# Patient Record
Sex: Male | Born: 1995 | Race: Black or African American | Hispanic: No | Marital: Single
Health system: Southern US, Community
[De-identification: ages and names within clinical notes are randomized; demographics above are authoritative.]

---

## 2019-09-17 ENCOUNTER — Other Ambulatory Visit: Payer: Self-pay

## 2019-09-17 ENCOUNTER — Encounter (HOSPITAL_COMMUNITY): Payer: Self-pay

## 2019-09-17 ENCOUNTER — Emergency Department (HOSPITAL_COMMUNITY)
Admission: EM | Admit: 2019-09-17 | Discharge: 2019-09-18 | Disposition: A | Discharge status: Home / Self Care | Payer: Self-pay | Attending: Emergency Medicine | Admitting: Emergency Medicine

## 2019-09-17 DIAGNOSIS — Z20828 Contact with and (suspected) exposure to other viral communicable diseases: Secondary | ICD-10-CM | POA: Insufficient documentation

## 2019-09-17 DIAGNOSIS — J069 Acute upper respiratory infection, unspecified: Secondary | ICD-10-CM | POA: Insufficient documentation

## 2019-09-17 NOTE — ED Triage Notes (Signed)
Patient arrived stating that yesterday he began having a fever, cough, and some shortness of breath on exertion. States he does work but unsure of possible exposure to Covid-19. Patient taking Robitussin, Musinex, and Tylenol at home.

## 2019-09-18 ENCOUNTER — Telehealth: Payer: Self-pay

## 2019-09-18 ENCOUNTER — Emergency Department (HOSPITAL_COMMUNITY): Payer: Self-pay

## 2019-09-18 LAB — BASIC METABOLIC PANEL
Anion gap: 12 (ref 5–15)
BUN: 10 mg/dL (ref 6–20)
CO2: 22 mmol/L (ref 22–32)
Calcium: 9.3 mg/dL (ref 8.9–10.3)
Chloride: 105 mmol/L (ref 98–111)
Creatinine, Ser: 1.08 mg/dL (ref 0.61–1.24)
GFR calc Af Amer: >60 mL/min (ref >60)
GFR calc non Af Amer: >60 mL/min (ref >60)
Glucose, Bld: 98 mg/dL (ref 70–99)
Potassium: 3.6 mmol/L (ref 3.5–5.1)
Sodium: 139 mmol/L (ref 135–145)

## 2019-09-18 LAB — CBC WITH DIFFERENTIAL/PLATELET
Abs Immature Granulocytes: 0.08 10*3/uL — ABNORMAL HIGH (ref 0.00–0.07)
Basophils Absolute: 0 10*3/uL (ref 0.0–0.1)
Basophils Relative: 0 %
Eosinophils Absolute: 0.2 10*3/uL (ref 0.0–0.5)
Eosinophils Relative: 1 %
HCT: 48.2 % (ref 39.0–52.0)
Hemoglobin: 16.3 g/dL (ref 13.0–17.0)
Immature Granulocytes: 0 %
Lymphocytes Relative: 8 %
Lymphs Abs: 1.3 10*3/uL (ref 0.7–4.0)
MCH: 30.5 pg (ref 26.0–34.0)
MCHC: 33.8 g/dL (ref 30.0–36.0)
MCV: 90.1 fL (ref 80.0–100.0)
Monocytes Absolute: 1.3 10*3/uL — ABNORMAL HIGH (ref 0.1–1.0)
Monocytes Relative: 7 %
Neutro Abs: 14.8 10*3/uL — ABNORMAL HIGH (ref 1.7–7.7)
Neutrophils Relative %: 84 %
Platelets: 214 10*3/uL (ref 150–400)
RBC: 5.35 MIL/uL (ref 4.22–5.81)
RDW: 13.3 % (ref 11.5–15.5)
WBC: 17.8 10*3/uL — ABNORMAL HIGH (ref 4.0–10.5)
nRBC: 0 % (ref 0.0–0.2)

## 2019-09-18 LAB — SARS CORONAVIRUS 2 (TAT 6-24 HRS): SARS Coronavirus 2: NEGATIVE

## 2019-09-18 MED ORDER — SODIUM CHLORIDE 0.9 % IV BOLUS
1000.0000 mL | Freq: Once | INTRAVENOUS | Status: AC
  Administered 2019-09-18: 1000 mL via INTRAVENOUS

## 2019-09-18 NOTE — Discharge Instructions (Addendum)
Drink plenty of fluids and get plenty of rest.  Tylenol 1000 mg rotated with ibuprofen 600 mg every 4 hours as needed for pain or fever.  Take over-the-counter medications as needed for relief of symptoms.  Return to the emergency department if you develop severe chest pain, difficulty breathing, or other new and concerning symptoms.         Infection Prevention Recommendations for Individuals Confirmed to have, or Being Evaluated for, 2019 Novel Coronavirus (COVID-19) Infection Who Receive Care at Home  Individuals who are confirmed to have, or are being evaluated for, COVID-19 should follow the prevention steps below until a healthcare provider or local or state health department says they can return to normal activities.  Stay home except to get medical care You should restrict activities outside your home, except for getting medical care. Do not go to work, school, or public areas, and do not use public transportation or taxis.  Call ahead before visiting your doctor Before your medical appointment, call the healthcare provider and tell them that you have, or are being evaluated for, COVID-19 infection. This will help the healthcare providers office take steps to keep other people from getting infected. Ask your healthcare provider to call the local or state health department.  Monitor your symptoms Seek prompt medical attention if your illness is worsening (e.g., difficulty breathing). Before going to your medical appointment, call the healthcare provider and tell them that you have, or are being evaluated for, COVID-19 infection. Ask your healthcare provider to call the local or state health department.  Wear a facemask You should wear a facemask that covers your nose and mouth when you are in the same room with other people and when you visit a healthcare provider. People who live with or visit you should also wear a facemask while they are in the same room with  you.  Separate yourself from other people in your home As much as possible, you should stay in a different room from other people in your home. Also, you should use a separate bathroom, if available.  Avoid sharing household items You should not share dishes, drinking glasses, cups, eating utensils, towels, bedding, or other items with other people in your home. After using these items, you should wash them thoroughly with soap and water.  Cover your coughs and sneezes Cover your mouth and nose with a tissue when you cough or sneeze, or you can cough or sneeze into your sleeve. Throw used tissues in a lined trash can, and immediately wash your hands with soap and water for at least 20 seconds or use an alcohol-based hand rub.  Wash your [facility] your hands often and thoroughly with soap and water for at least 20 seconds. You can use an alcohol-based hand sanitizer if soap and water are not available and if your hands are not visibly dirty. Avoid touching your eyes, nose, and mouth with unwashed hands.   Prevention Steps for Caregivers and Household Members of Individuals Confirmed to have, or Being Evaluated for, COVID-19 Infection Being Cared for in the Home  If you live with, or provide care at home for, a person confirmed to have, or being evaluated for, COVID-19 infection please follow these guidelines to prevent infection:  Follow healthcare providers instructions Make sure that you understand and can help the patient follow any healthcare provider instructions for all care.  Provide for the patients basic needs You should help the patient with basic needs in the home and provide  support for getting groceries, prescriptions, and other personal needs.  Monitor the patients symptoms If they are getting sicker, call his or her medical provider and tell them that the patient has, or is being evaluated for, COVID-19 infection. This will help the healthcare providers office  take steps to keep other people from getting infected. Ask the healthcare provider to call the local or state health department.  Limit the number of people who have contact with the patient If possible, have only one caregiver for the patient. Other household members should stay in another home or place of residence. If this is not possible, they should stay in another room, or be separated from the patient as much as possible. Use a separate bathroom, if available. Restrict visitors who do not have an essential need to be in the home.  Keep older adults, very young children, and other sick people away from the patient Keep older adults, very young children, and those who have compromised immune systems or chronic health conditions away from the patient. This includes people with chronic heart, lung, or kidney conditions, diabetes, and cancer.  Ensure good ventilation Make sure that shared spaces in the home have good air flow, such as from an air conditioner or an opened window, weather permitting.  Wash your hands often Wash your hands often and thoroughly with soap and water for at least 20 seconds. You can use an alcohol based hand sanitizer if soap and water are not available and if your hands are not visibly dirty. Avoid touching your eyes, nose, and mouth with unwashed hands. Use disposable paper towels to dry your hands. If not available, use dedicated cloth towels and replace them when they become wet.  Wear a facemask and gloves Wear a disposable facemask at all times in the room and gloves when you touch or have contact with the patients blood, body fluids, and/or secretions or excretions, such as sweat, saliva, sputum, nasal mucus, vomit, urine, or feces.  Ensure the mask fits over your nose and mouth tightly, and do not touch it during use. Throw out disposable facemasks and gloves after using them. Do not reuse. Wash your hands immediately after removing your facemask and  gloves. If your personal clothing becomes contaminated, carefully remove clothing and launder. Wash your hands after handling contaminated clothing. Place all used disposable facemasks, gloves, and other waste in a lined container before disposing them with other household waste. Remove gloves and wash your hands immediately after handling these items.  Do not share dishes, glasses, or other household items with the patient Avoid sharing household items. You should not share dishes, drinking glasses, cups, eating utensils, towels, bedding, or other items with a patient who is confirmed to have, or being evaluated for, COVID-19 infection. After the person uses these items, you should wash them thoroughly with soap and water.  Wash laundry thoroughly Immediately remove and wash clothes or bedding that have blood, body fluids, and/or secretions or excretions, such as sweat, saliva, sputum, nasal mucus, vomit, urine, or feces, on them. Wear gloves when handling laundry from the patient. Read and follow directions on labels of laundry or clothing items and detergent. In general, wash and dry with the warmest temperatures recommended on the label.  Clean all areas the individual has used often Clean all touchable surfaces, such as counters, tabletops, doorknobs, bathroom fixtures, toilets, phones, keyboards, tablets, and bedside tables, every day. Also, clean any surfaces that may have blood, body fluids, and/or secretions or excretions  on them. Wear gloves when cleaning surfaces the patient has come in contact with. Use a diluted bleach solution (e.g., dilute bleach with 1 part bleach and 10 parts water) or a household disinfectant with a label that says EPA-registered for coronaviruses. To make a bleach solution at home, add 1 tablespoon of bleach to 1 quart (4 cups) of water. For a larger supply, add  cup of bleach to 1 gallon (16 cups) of water. Read labels of cleaning products and follow  recommendations provided on product labels. Labels contain instructions for safe and effective use of the cleaning product including precautions you should take when applying the product, such as wearing gloves or eye protection and making sure you have good ventilation during use of the product. Remove gloves and wash hands immediately after cleaning.  Monitor yourself for signs and symptoms of illness Caregivers and household members are considered close contacts, should monitor their health, and will be asked to limit movement outside of the home to the extent possible. Follow the monitoring steps for close contacts listed on the symptom monitoring form.   ? If you have additional questions, contact your local health department or call the epidemiologist on call at [phone] (available 24/7). ? This guidance is subject to change. For the most up-to-date guidance from [facility], please refer to their website: [email or web address]

## 2019-09-18 NOTE — ED Provider Notes (Signed)
[facility] DEPT Provider Note   CSN: [id number] Arrival date & time: 09/17/19  2141     History   Chief Complaint Chief Complaint  Patient presents with  . Fever    HPI Derek Lucas is a 23 y.o. male.     Patient is a 23 year old male with no significant past medical history.  He presents today for evaluation of fever and cough since yesterday.  He describes chest congestion and productive cough.  He describes possible exposures to COVID-19 at work, but no family members ill.  The history is provided by the patient.  Fever Severity:  Moderate Onset quality:  Gradual Duration:  2 days Timing:  Constant Progression:  Worsening Chronicity:  New Relieved by:  Nothing Worsened by:  Nothing Ineffective treatments:  None tried Associated symptoms: congestion and cough   Associated symptoms: no chills and no confusion     History reviewed. No pertinent past medical history.  There are no active problems to display for this patient.   History reviewed. No pertinent surgical history.      Home Medications    Prior to Admission medications   Not on File    Family History No family history on file.  Social History Social History   Tobacco Use  . Smoking status: Not on file  Substance Use Topics  . Alcohol use: Not on file  . Drug use: Not on file     Allergies   Penicillins   Review of Systems Review of Systems  Constitutional: Positive for fever. Negative for chills.  HENT: Positive for congestion.   Respiratory: Positive for cough.   Psychiatric/Behavioral: Negative for confusion.  All other systems reviewed and are negative.    Physical Exam Updated Vital Signs BP 123/82 (BP Location: Left Arm)   Pulse (!) 108   Temp 99 F (37.2 C) (Oral)   Resp 15   Ht 6' (1.829 m)   Wt 117.9 kg   SpO2 92%   BMI 35.26 kg/m   Physical Exam Vitals signs and nursing note reviewed.  Constitutional:      General: He  is not in acute distress.    Appearance: He is well-developed. He is not diaphoretic.  HENT:     Head: Normocephalic and atraumatic.  Neck:     Musculoskeletal: Normal range of motion and neck supple.  Cardiovascular:     Rate and Rhythm: Regular rhythm. Tachycardia present.     Heart sounds: No murmur. No friction rub.  Pulmonary:     Effort: Pulmonary effort is normal. No respiratory distress.     Breath sounds: Normal breath sounds. No wheezing or rales.  Abdominal:     General: Bowel sounds are normal. There is no distension.     Palpations: Abdomen is soft.     Tenderness: There is no abdominal tenderness.  Musculoskeletal: Normal range of motion.        General: No swelling, tenderness, deformity or signs of injury.     Right lower leg: No edema.     Left lower leg: No edema.  Skin:    General: Skin is warm and dry.  Neurological:     Mental Status: He is alert and oriented to person, place, and time.     Coordination: Coordination normal.      ED Treatments / Results  Labs (all labs ordered are listed, but only abnormal results are displayed) Labs Reviewed  SARS CORONAVIRUS 2 (TAT 6-24 HRS)  BASIC METABOLIC  PANEL  CBC WITH DIFFERENTIAL/PLATELET    EKG None  Radiology No results found.  Procedures Procedures (including critical care time)  Medications Ordered in ED Medications  sodium chloride 0.9 % bolus 1,000 mL (has no administration in time range)     Initial Impression / Assessment and Plan / ED Course  I have reviewed the triage vital signs and the nursing notes.  Pertinent labs & imaging results that were available during my care of the patient were reviewed by me and considered in my medical decision making (see chart for details).  Patient presenting here with complaints of fever, cough, and not feeling well for the past couple of days.  Patient's chest x-ray and lung exam are clear.  Patient will be tested for COVID-19.  He is slightly  tachycardic here in the ER and was given a liter of fluid with some improvement in his heart rate.  Patient will be discharged and advised to isolate at home until the results of his Covid test are known.  He is to drink plenty of fluids and return to the ER if he develops difficulty breathing or severe chest pain.  Derek Lucas was evaluated in Emergency Department on 09/18/2019 for the symptoms described in the history of present illness. He was evaluated in the context of the global COVID-19 pandemic, which necessitated consideration that the patient might be at risk for infection with the SARS-CoV-2 virus that causes COVID-19. Institutional protocols and algorithms that pertain to the evaluation of patients at risk for COVID-19 are in a state of rapid change based on information released by regulatory bodies including the CDC and federal and state organizations. These policies and algorithms were followed during the patient's care in the ED.   Final Clinical Impressions(s) / ED Diagnoses   Final diagnoses:  None    ED Discharge Orders    None       Geoffery Lyons, MD 09/18/19 [id number]

## 2019-09-18 NOTE — Telephone Encounter (Signed)
Received call from patient checking Covid results.  Advised results negative.   

## 2020-08-07 IMAGING — DX DG CHEST 1V PORT
1 series · 1 of 1 positions shown · non-contrast
Comparison: None.

CLINICAL DATA: Cough and shortness of breath

EXAM:
PORTABLE CHEST 1 VIEW

[chest ap]
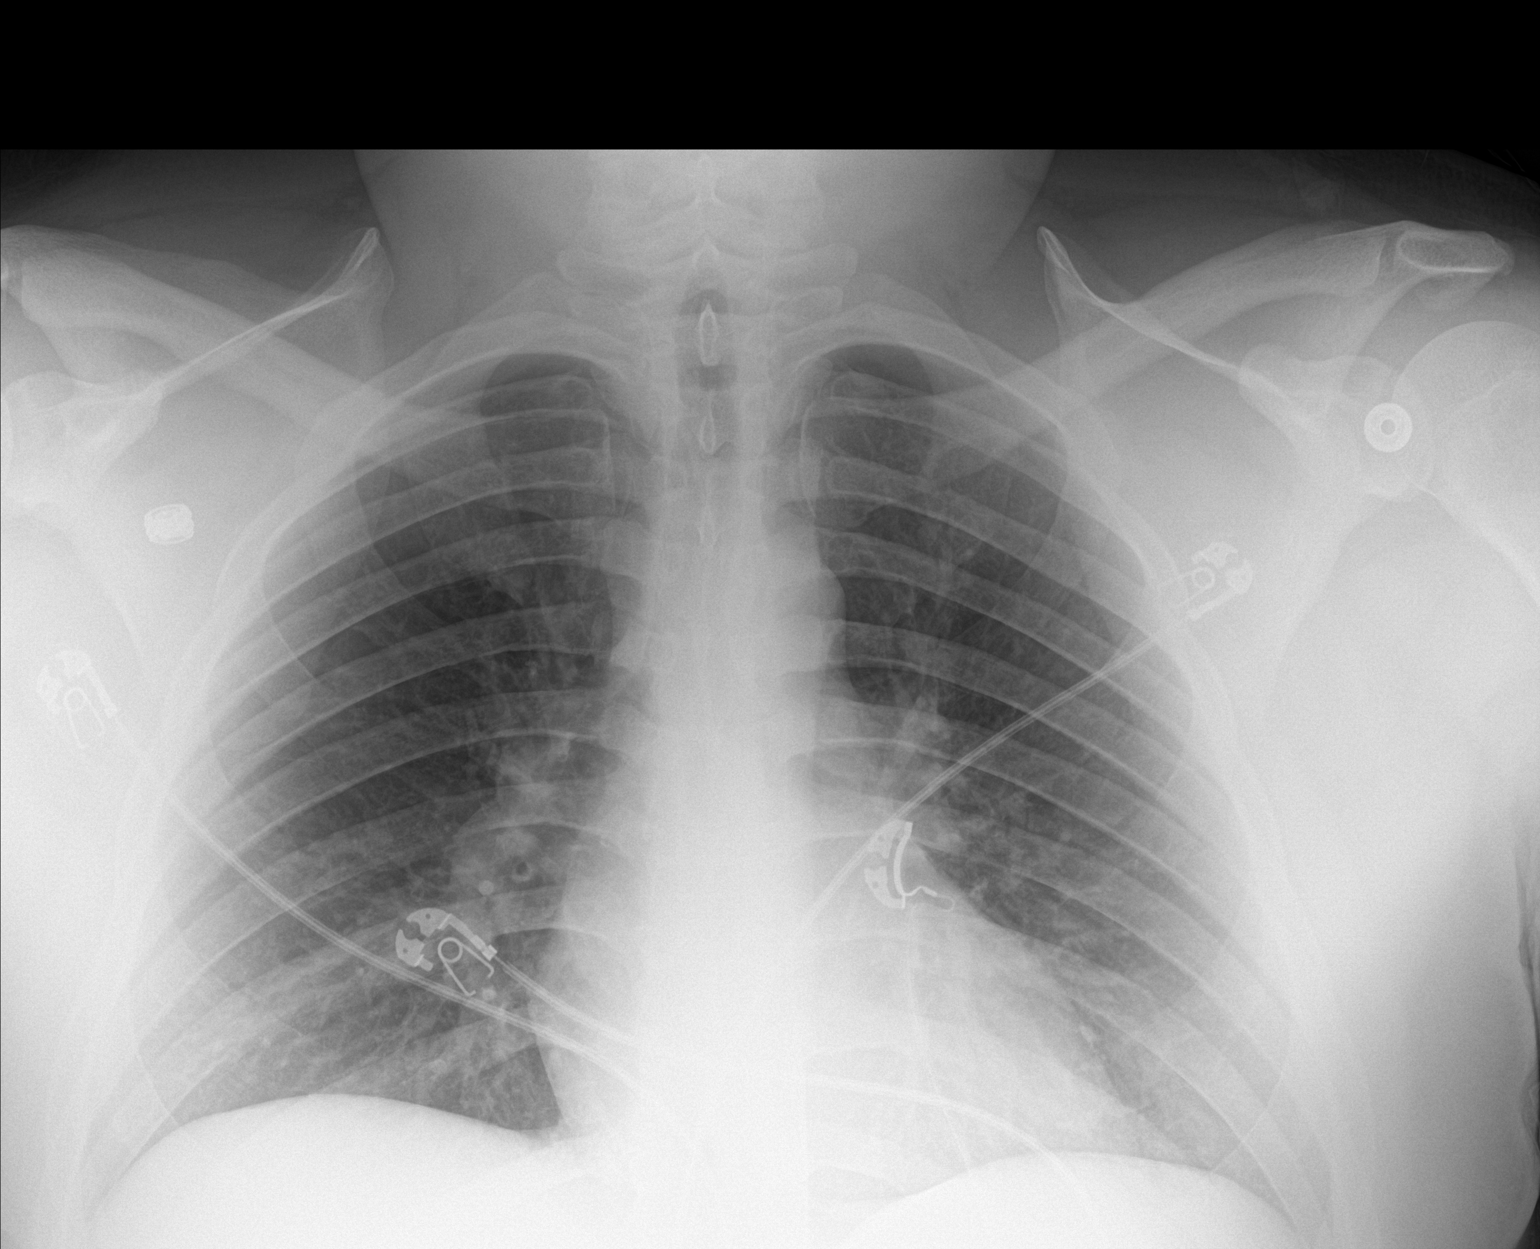

[1 of 1 positions shown; findings below may reference images not displayed]

FINDINGS: The heart size and mediastinal contours are within normal limits.
Both lungs are clear. The visualized skeletal structures are
unremarkable.
IMPRESSION: No active disease.
